# Patient Record
Sex: Male | Born: 2000 | Race: White | Hispanic: No | Marital: Single | Smoking: Never smoker
Health system: Southern US, Community
[De-identification: ages and names within clinical notes are randomized; demographics above are authoritative.]

## PROBLEM LIST (undated history)

## (undated) DIAGNOSIS — L0591 Pilonidal cyst without abscess: Secondary | ICD-10-CM

## (undated) HISTORY — DX: Pilonidal cyst without abscess: L05.91

## (undated) HISTORY — PX: NO PAST SURGERIES: SHX2092

---
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
## 2018-04-08 ENCOUNTER — Ambulatory Visit (INDEPENDENT_AMBULATORY_CARE_PROVIDER_SITE_OTHER): Payer: BLUE CROSS/BLUE SHIELD | Admitting: Family Medicine

## 2018-04-08 ENCOUNTER — Encounter: Payer: Self-pay | Admitting: Family Medicine

## 2018-04-08 VITALS — BP 110/64 | HR 80 | Temp 98.1°F | Resp 16 | Ht 69.0 in | Wt 181.0 lb

## 2018-04-08 DIAGNOSIS — Z0000 Encounter for general adult medical examination without abnormal findings: Secondary | ICD-10-CM | POA: Diagnosis not present

## 2018-04-08 DIAGNOSIS — R251 Tremor, unspecified: Secondary | ICD-10-CM | POA: Insufficient documentation

## 2018-04-08 NOTE — Progress Notes (Signed)
Patient: Timothy Benson, Male    DOB: 2001-02-13, 17 y.o.   MRN: [id number] Visit Date: 04/08/2018  Today's Provider: Lelon Huh, MD   Chief Complaint  Patient presents with  . Annual Exam   Subjective:    Annual physical exam Timothy Benson is a 17 y.o. male who presents today for health maintenance and complete physical. He feels well. He reports exercising yes. He reports he is sleeping well. 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 He is enjoying school.   ---------------------------------------------------------------   Review of Systems  Neurological: Positive for tremors.  All other systems reviewed and are negative.   Social History      He  reports that he has never smoked. He has never used smokeless tobacco. He reports that he does not drink alcohol or use drugs.       Social History   Socioeconomic History  . Marital status: Single    Spouse name: Not on file  . Number of children: Not on file  . Years of education: Not on file  . Highest education level: Not on file  Occupational History  . Occupation: Full time student    Comment: Lives with parents  Social Needs  . Financial resource strain: Not on file  . Food insecurity:    Worry: Not on file    Inability: Not on file  . Transportation needs:    Medical: Not on file    Non-medical: Not on file  Tobacco Use  . Smoking status: Never Smoker  . Smokeless tobacco: Never Used  Substance and Sexual Activity  . Alcohol use: No    Alcohol/week: 0.0 oz  . Drug use: No  . Sexual activity: Not on file  Lifestyle  . Physical activity:    Days per week: Not on file    Minutes per session: Not on file  . Stress: Not on file  Relationships  . Social connections:    Talks on phone: Not on file    Gets together: Not on file    Attends religious service: Not on file    Active member of club or organization: Not on file    Attends meetings of  clubs or organizations: Not on file    Relationship status: Not on file  Other Topics Concern  . Not on file  Social History Narrative   Lives with mom and dad   homeschooled   Loves soccer, wants to be a Microbiologist    Past Medical History:  Diagnosis Date  . Pilonidal cyst 07/02/2015     Patient Active Problem List   Diagnosis Date Noted  . Headache 07/02/2015  . Allergic rhinitis 03/05/2008    Past Surgical History:  Procedure Laterality Date  . NO PAST SURGERIES      Family History        Family Status  Relation Name Status  . Mother  Alive  . Father  Alive  . Brother half brother Alive  . Other general family hx Alive        His family history includes Hypertension in his other; Migraines in his mother; Obesity in his mother.      No Known Allergies   Current Outpatient Medications:  .  ibuprofen (ADVIL,MOTRIN) 200 MG tablet, Take 600 mg by mouth every 6 (six) hours as needed for pain., Disp: , Rfl:    Patient Care Team: Birdie Sons,  MD as PCP - General (Family Medicine) Birdie Sons, MD as Referring Physician (Family Medicine) Robert Bellow, MD (General Surgery)      Objective:   Vitals: BP 110/64 (BP Location: Right Arm, Patient Position: Sitting, Cuff Size: Large)   Pulse 80   Temp 98.1 F (36.7 C) (Oral)   Resp 16   Ht '5\' 9"'  (1.753 m)   Wt 181 lb (82.1 kg)   SpO2 94%   BMI 26.73 kg/m    Vitals:   04/08/18 1427  BP: 110/64  Pulse: 80  Resp: 16  Temp: 98.1 F (36.7 C)  TempSrc: Oral  SpO2: 94%  Weight: 181 lb (82.1 kg)  Height: '5\' 9"'  (1.753 m)     Physical Exam   General Appearance:    Alert, cooperative, no distress, appears stated age  Head:    Normocephalic, without obvious abnormality, atraumatic  Eyes:    PERRL, conjunctiva/corneas clear, EOM's intact, fundi    benign, both eyes       Ears:    Normal TM's and external ear canals, both ears  Nose:   Nares normal, septum midline, mucosa normal,  no drainage   or sinus tenderness  Throat:   Lips, mucosa, and tongue normal; teeth and gums normal  Neck:   Supple, symmetrical, trachea midline, no adenopathy;       thyroid:  No enlargement/tenderness/nodules; no carotid   bruit or JVD  Back:     Symmetric, no curvature, ROM normal, no CVA tenderness  Lungs:     Clear to auscultation bilaterally, respirations unlabored  Chest wall:    No tenderness or deformity  Heart:    Regular rate and rhythm, S1 and S2 normal, no murmur, rub   or gallop  Abdomen:     Soft, non-tender, bowel sounds active all four quadrants,    no masses, no organomegaly  Genitalia:    deferred  Rectal:    deferred  Extremities:   Extremities normal, atraumatic, no cyanosis or edema  Pulses:   2+ and symmetric all extremities  Skin:   Skin color, texture, turgor normal, no rashes or lesions  Lymph nodes:   Cervical, supraclavicular, and axillary nodes normal  Neurologic:   CNII-XII intact. Normal strength, sensation and reflexes      throughout    Depression Screen PHQ 2/9 Scores 04/08/2018 03/12/2017  PHQ - 2 Score 0 0  PHQ- 9 Score 0 0    Visual Acuity Screening   Right eye Left eye Both eyes  Without correction: '20/20 20/20 20/20 '  With correction:         Assessment & Plan:     Routine Health Maintenance and Physical Exam  Exercise Activities and Dietary recommendations Goals    None      Immunization History  Administered Date(s) Administered  . DTP 08/22/1999, 10/23/1999, 12/24/1999, 12/16/2000, 12/16/2004  . HPV 9-valent 05/01/2016  . HPV Quadrivalent 06/19/2014  . Hepatitis A 07/13/2007, 05/04/2008  . Hepatitis B 11/15/98, 07/24/1999, 10/07/2000  . HiB (PRP-OMP) 08/22/1999, 12/24/1999, 04/09/2000, 12/16/2000  . Influenza Whole 07/03/2009  . Influenza,inj,Quad PF,6+ Mos 07/03/2015, 07/16/2016  . MMR 06/21/2000, 12/16/2004  . Meningococcal B, OMV 05/01/2016, 02/23/2017  . Meningococcal Conjugate 06/28/2012, 05/01/2016  . OPV  10/23/1999, 04/09/2000, 10/07/2000, 12/16/2004  . Pneumococcal-Unspecified 08/22/1999, 10/23/1999, 12/24/1999, 06/21/2000  . Tdap 10/09/2010  . Varicella 06/21/2000, 05/04/2008    Health Maintenance  Topic Date Due  . HIV Screening  06/16/2014  . INFLUENZA VACCINE  04/21/2018  Discussed health benefits of physical activity, and encouraged him to engage in regular exercise appropriate for his age and condition.    --------------------------------------------------------------------  1. Annual physical exam  - Comprehensive metabolic panel - Lipid panel - Magnesium - TSH  2. Tremor Long-standing, unchanged, had had complete evaluation by pediatric neurologist.  - Comprehensive metabolic panel - Lipid panel - Magnesium - TSH   Lelon Huh, MD  [facility]

## 2018-04-08 NOTE — Patient Instructions (Signed)
   Perform testicular checks once a month and notify your healthcare provider if you develop any new lumps or masses, especially ones that are painless or hard like a pebble.

## 2018-04-09 LAB — TSH: TSH: 1.84 u[IU]/mL (ref 0.450–4.500)

## 2018-04-09 LAB — COMPREHENSIVE METABOLIC PANEL
ALBUMIN: 4.9 g/dL (ref 3.5–5.5)
ALK PHOS: 77 IU/L (ref 56–127)
ALT: 45 IU/L — ABNORMAL HIGH (ref 0–44)
AST: 32 IU/L (ref 0–40)
Albumin/Globulin Ratio: 2.5 — ABNORMAL HIGH (ref 1.2–2.2)
BILIRUBIN TOTAL: 0.4 mg/dL (ref 0.0–1.2)
BUN / CREAT RATIO: 12 (ref 9–20)
BUN: 12 mg/dL (ref 6–20)
CHLORIDE: 105 mmol/L (ref 96–106)
CO2: 23 mmol/L (ref 20–29)
CREATININE: 1.04 mg/dL (ref 0.76–1.27)
Calcium: 9.7 mg/dL (ref 8.7–10.2)
GFR calc Af Amer: 121 mL/min/{1.73_m2} (ref >59)
GFR calc non Af Amer: 104 mL/min/{1.73_m2} (ref >59)
GLOBULIN, TOTAL: 2 g/dL (ref 1.5–4.5)
GLUCOSE: 93 mg/dL (ref 65–99)
Potassium: 4.2 mmol/L (ref 3.5–5.2)
SODIUM: 143 mmol/L (ref 134–144)
Total Protein: 6.9 g/dL (ref 6.0–8.5)

## 2018-04-09 LAB — LIPID PANEL
CHOLESTEROL TOTAL: 150 mg/dL (ref 100–169)
Chol/HDL Ratio: 3.1 ratio (ref 0.0–5.0)
HDL: 49 mg/dL (ref >39)
LDL Calculated: 81 mg/dL (ref 0–109)
TRIGLYCERIDES: 102 mg/dL — AB (ref 0–89)
VLDL Cholesterol Cal: 20 mg/dL (ref 5–40)

## 2018-04-09 LAB — MAGNESIUM: Magnesium: 1.9 mg/dL (ref 1.6–2.3)

## 2018-04-11 ENCOUNTER — Telehealth: Payer: Self-pay | Admitting: *Deleted

## 2018-04-11 NOTE — Telephone Encounter (Signed)
Patient's mom Victorino DikeJennifer was given a copy of pt's labs results.

## 2018-04-11 NOTE — Telephone Encounter (Signed)
LMOVM for pt to return call 

## 2018-04-11 NOTE — Telephone Encounter (Signed)
-----   Message from Malva Limesonald E Fisher, MD sent at 04/11/2018  7:58 AM EDT ----- Cholesterol is good at 150. All other labs including thyroid, electrolytes, and magnesium levels are normal. Check labs every 3-4 years.
